# Patient Record
Sex: Female | Born: 1985 | Race: White | Hispanic: No | Marital: Single | Smoking: Current every day smoker
Health system: Southern US, Community
[De-identification: ages and names within clinical notes are randomized; demographics above are authoritative.]

## PROBLEM LIST (undated history)

## (undated) DIAGNOSIS — F99 Mental disorder, not otherwise specified: Secondary | ICD-10-CM

## (undated) HISTORY — PX: BREAST SURGERY: SHX581

## (undated) HISTORY — DX: Mental disorder, not otherwise specified: F99

---
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
## 2012-06-03 ENCOUNTER — Ambulatory Visit (INDEPENDENT_AMBULATORY_CARE_PROVIDER_SITE_OTHER): Payer: BC Managed Care – PPO | Admitting: General Practice

## 2012-06-03 VITALS — BP 104/74 | HR 75 | Temp 97.8°F

## 2012-06-03 DIAGNOSIS — H8110 Benign paroxysmal vertigo, unspecified ear: Secondary | ICD-10-CM

## 2012-06-03 MED ORDER — MECLIZINE HCL 25 MG PO TABS: 25.0000 mg | ORAL_TABLET | Freq: Three times a day (TID) | ORAL | Status: DC | PRN

## 2012-06-03 MED ORDER — MECLIZINE HCL 25 MG PO TABS: 25.0000 mg | ORAL_TABLET | Freq: Three times a day (TID) | ORAL | Status: AC | PRN

## 2012-06-03 MED ORDER — METHYLPREDNISOLONE ACETATE 80 MG/ML IJ SUSP
80.0000 mg | Freq: Once | INTRAMUSCULAR | Status: AC
  Administered 2012-06-03: 80 mg via INTRAMUSCULAR

## 2012-06-03 NOTE — Patient Instructions (Signed)
Vertigo Vertigo means you feel like you or your surroundings are moving when they are not. Vertigo can be dangerous if it occurs when you are at work, driving, or performing difficult activities.  CAUSES  Vertigo occurs when there is a conflict of signals sent to your brain from the visual and sensory systems in your body. There are many different causes of vertigo, including:  Infections, especially in the inner ear.  A bad reaction to a drug or misuse of alcohol and medicines.  Withdrawal from drugs or alcohol.  Rapidly changing positions, such as lying down or rolling over in bed.  A migraine headache.  Decreased blood flow to the brain.  Increased pressure in the brain from a head injury, infection, tumor, or bleeding. SYMPTOMS  You may feel as though the world is spinning around or you are falling to the ground. Because your balance is upset, vertigo can cause nausea and vomiting. You may have involuntary eye movements (nystagmus). DIAGNOSIS  Vertigo is usually diagnosed by physical exam. If the cause of your vertigo is unknown, your caregiver may perform imaging tests, such as an MRI scan (magnetic resonance imaging). TREATMENT  Most cases of vertigo resolve on their own, without treatment. Depending on the cause, your caregiver may prescribe certain medicines. If your vertigo is related to body position issues, your caregiver may recommend movements or procedures to correct the problem. In rare cases, if your vertigo is caused by certain inner ear problems, you may need surgery. HOME CARE INSTRUCTIONS   Follow your caregiver's instructions.  Avoid driving.  Avoid operating heavy machinery.  Avoid performing any tasks that would be dangerous to you or others during a vertigo episode.  Tell your caregiver if you notice that certain medicines seem to be causing your vertigo. Some of the medicines used to treat vertigo episodes can actually make them worse in some people. SEEK  IMMEDIATE MEDICAL CARE IF:   Your medicines do not relieve your vertigo or are making it worse.  You develop problems with talking, walking, weakness, or using your arms, hands, or legs.  You develop severe headaches.  Your nausea or vomiting continues or gets worse.  You develop visual changes.  A family member notices behavioral changes.  Your condition gets worse. MAKE SURE YOU:  Understand these instructions.  Will watch your condition.  Will get help right away if you are not doing well or get worse. Document Released: 10/04/2004 Document Revised: 03/19/2011 Document Reviewed: 07/13/2010 Exit[employer] Patient Information 2014 [employer].  

## 2012-06-03 NOTE — Progress Notes (Signed)
  Subjective:    Patient ID: Nancy Willis, female    DOB: [birth year], 27 y.o.   MRN: [id number]  HPI Presents today with dizziness. Reports onset was Friday while on vacation. She feeling like she is "flying" and in a tunnel. Symptoms worsen with rapid change of position. Denies OTC medications.     Review of Systems  Constitutional: Negative for fever and chills.  Respiratory: Negative for chest tightness and shortness of breath.   Cardiovascular: Negative for chest pain and palpitations.  Gastrointestinal: Negative for abdominal pain.  Genitourinary: Negative for difficulty urinating.  Neurological: Positive for dizziness and light-headedness. Negative for facial asymmetry, speech difficulty, weakness, numbness and headaches.  Psychiatric/Behavioral: Negative.        Objective:   Physical Exam  Constitutional: She is oriented to person, place, and time. She appears well-developed and well-nourished.  HENT:  Head: Normocephalic and atraumatic.  Right Ear: External ear normal.  Left Ear: External ear normal.  Eyes: EOM are normal. Pupils are equal, round, and reactive to light.  Cardiovascular: Normal rate, regular rhythm and normal heart sounds.   Pulmonary/Chest: Effort normal and breath sounds normal. No respiratory distress. She exhibits no tenderness.  Neurological: She is alert and oriented to person, place, and time.  Skin: Skin is warm and dry.  Psychiatric: She has a normal mood and affect.          Assessment & Plan:  1. Benign paroxysmal positional vertigo - methylPREDNISolone acetate (DEPO-MEDROL) injection 80 mg; Inject 1 mL (80 mg total) into the muscle once. - meclizine (ANTIVERT) 25 MG tablet; Take 1 tablet (25 mg total) by mouth 3 (three) times daily as needed.  Dispense: 30 tablet; Refill: 0 -Discussed safety precautions -discussed refraining from driving or operating machinery RTO if symptoms worsen Patient verbalized understanding Coralie Keens, FNP-C

## 2012-10-21 ENCOUNTER — Ambulatory Visit (INDEPENDENT_AMBULATORY_CARE_PROVIDER_SITE_OTHER): Payer: BC Managed Care – PPO | Admitting: *Deleted

## 2012-10-21 DIAGNOSIS — Z23 Encounter for immunization: Secondary | ICD-10-CM

## 2012-10-27 ENCOUNTER — Encounter: Payer: Self-pay | Admitting: Advanced Practice Midwife

## 2012-10-27 ENCOUNTER — Ambulatory Visit (INDEPENDENT_AMBULATORY_CARE_PROVIDER_SITE_OTHER): Payer: BC Managed Care – PPO | Admitting: Advanced Practice Midwife

## 2012-10-27 VITALS — BP 104/72 | HR 88 | Resp 18 | Ht 63.0 in | Wt 154.4 lb

## 2012-10-27 DIAGNOSIS — Z3046 Encounter for surveillance of implantable subdermal contraceptive: Secondary | ICD-10-CM

## 2012-10-27 NOTE — Progress Notes (Signed)
Procedure Note Removal of Nexplanon  Patient had Nexplanon inserted in 2102. Desires removal today. Patient desires fertility.   Reviewed risk and benefits of procedure. Alternative options discussed Patient reported understanding and agreed to continue.   The patient's left arm was palpated and the implant device located. The area was prepped with Betadinex3. The distal end of the device was palpated and 1 cc of 1% lidocaine without epinephrine was injected. A 5 mm incision was made. Any fibrotic tissue was carefully dissected away using blunt and/or sharp dissection. The device was removed in an intact manner. Steri-strips and a sterile dressing were applied to the incision.   Followup: The patient tolerated the procedure well without complications. Standard post-procedure care is explained and return precautions are given.  Patient plans no method  25 min spent with patient greater than 80% spent in counseling and coordination of care.    Avigail Pilling Wilson Singer CNM

## 2012-11-21 ENCOUNTER — Other Ambulatory Visit: Payer: Self-pay | Admitting: General Practice

## 2012-11-21 DIAGNOSIS — J029 Acute pharyngitis, unspecified: Secondary | ICD-10-CM

## 2012-11-21 MED ORDER — AMOXICILLIN 875 MG PO TABS: 875.0000 mg | ORAL_TABLET | Freq: Two times a day (BID) | ORAL | Status: DC

## 2012-12-09 ENCOUNTER — Other Ambulatory Visit: Payer: Self-pay | Admitting: Nurse Practitioner

## 2012-12-09 MED ORDER — AZITHROMYCIN 250 MG PO TABS: ORAL_TABLET | ORAL | Status: DC

## 2012-12-29 ENCOUNTER — Other Ambulatory Visit: Payer: Self-pay | Admitting: General Practice

## 2012-12-29 DIAGNOSIS — B373 Candidiasis of vulva and vagina: Secondary | ICD-10-CM

## 2012-12-29 MED ORDER — FLUCONAZOLE 150 MG PO TABS: 150.0000 mg | ORAL_TABLET | Freq: Once | ORAL | Status: DC

## 2013-01-15 ENCOUNTER — Other Ambulatory Visit: Payer: Self-pay | Admitting: Nurse Practitioner

## 2013-01-15 MED ORDER — FLUCONAZOLE 150 MG PO TABS: ORAL_TABLET | ORAL | Status: DC

## 2013-02-11 ENCOUNTER — Other Ambulatory Visit (INDEPENDENT_AMBULATORY_CARE_PROVIDER_SITE_OTHER): Payer: BC Managed Care – PPO | Admitting: *Deleted

## 2013-02-11 DIAGNOSIS — J020 Streptococcal pharyngitis: Secondary | ICD-10-CM

## 2013-02-11 MED ORDER — CEFTRIAXONE SODIUM 1 G IJ SOLR
1.0000 g | Freq: Once | INTRAMUSCULAR | Status: AC
  Administered 2013-02-11: 1 g via INTRAMUSCULAR

## 2013-02-18 NOTE — Progress Notes (Signed)
Rocephin give IM. Patient tolerated well.

## 2013-02-23 ENCOUNTER — Other Ambulatory Visit: Payer: Self-pay | Admitting: General Practice

## 2013-02-23 DIAGNOSIS — B373 Candidiasis of vulva and vagina: Secondary | ICD-10-CM

## 2013-02-23 DIAGNOSIS — B3731 Acute candidiasis of vulva and vagina: Secondary | ICD-10-CM

## 2013-02-23 MED ORDER — FLUCONAZOLE 150 MG PO TABS: 150.0000 mg | ORAL_TABLET | Freq: Once | ORAL | Status: DC

## 2013-03-24 ENCOUNTER — Other Ambulatory Visit: Payer: Self-pay | Admitting: *Deleted

## 2013-03-24 MED ORDER — OSELTAMIVIR PHOSPHATE 75 MG PO CAPS: 75.0000 mg | ORAL_CAPSULE | Freq: Every day | ORAL | Status: DC

## 2013-03-25 ENCOUNTER — Ambulatory Visit (INDEPENDENT_AMBULATORY_CARE_PROVIDER_SITE_OTHER): Payer: BC Managed Care – PPO | Admitting: Family Medicine

## 2013-03-25 ENCOUNTER — Encounter: Payer: Self-pay | Admitting: Family Medicine

## 2013-03-25 VITALS — BP 98/70 | HR 84 | Temp 97.1°F | Ht 62.0 in | Wt 146.0 lb

## 2013-03-25 DIAGNOSIS — R5383 Other fatigue: Secondary | ICD-10-CM

## 2013-03-25 DIAGNOSIS — K219 Gastro-esophageal reflux disease without esophagitis: Secondary | ICD-10-CM

## 2013-03-25 DIAGNOSIS — R5381 Other malaise: Secondary | ICD-10-CM

## 2013-03-25 DIAGNOSIS — Z0000 Encounter for general adult medical examination without abnormal findings: Secondary | ICD-10-CM

## 2013-03-25 DIAGNOSIS — R635 Abnormal weight gain: Secondary | ICD-10-CM

## 2013-03-25 MED ORDER — PHENTERMINE HCL 37.5 MG PO CAPS: 37.5000 mg | ORAL_CAPSULE | ORAL | Status: AC

## 2013-03-25 NOTE — Progress Notes (Signed)
   Subjective:    Patient ID: Nancy Willis, female    DOB: [birth year], 28 y.o.   MRN: [id number]  HPI  This 28 y.o. female presents for evaluation of CPE without pap.  She had pap in 01/23/13 and it was normal.  She does SBE's.  She has been having fatigue and she attributes it to recent weight gain. She is seeing psych for hx of bipolar and is stable.  She has not had recent labs.  She is a smoker And she does not want to quit.  Review of Systems    No chest pain, SOB, HA, dizziness, vision change, N/V, diarrhea, constipation, dysuria, urinary urgency or frequency, myalgias, arthralgias or rash.  Objective:   Physical Exam Vital signs noted  Well developed well nourished female.  HEENT - Head atraumatic Normocephalic                Eyes - PERRLA, Conjuctiva - clear Sclera- Clear EOMI                Ears - EAC's Wnl TM's Wnl Gross Hearing WNL                Nose - Nares patent                 Throat - oropharanx wnl Respiratory - Lungs CTA bilateral Cardiac - RRR S1 and S2 without murmur GI - Abdomen soft Nontender and bowel sounds active x 4 Extremities - No edema. Neuro - Grossly intact.       Assessment & Plan:  Weight gain - Plan: phentermine 37.5 MG capsule  Routine general medical examination at a health care facility - Plan: POCT CBC, CMP14+EGFR, Lipid panel, Thyroid Panel With TSH, Vitamin B12, Vit D  25 hydroxy (rtn osteoporosis monitoring)  GERD (gastroesophageal reflux disease) - Plan: POCT CBC, CMP14+EGFR, Lipid panel, Thyroid Panel With TSH, Vitamin B12, Vit D  25 hydroxy (rtn osteoporosis monitoring)  Other malaise and fatigue - Plan: Vitamin B12, Vit D  25 hydroxy (rtn osteoporosis monitoring)  Lysbeth Penner FNP

## 2013-03-26 LAB — POCT CBC
Granulocyte percent: 70.8 %G (ref 37–80)
HCT, POC: 43.2 % (ref 37.7–47.9)
Hemoglobin: 14 g/dL (ref 12.2–16.2)
Lymph, poc: 1.9 (ref 0.6–3.4)
MCH, POC: 28.7 pg (ref 27–31.2)
MCHC: 32.5 g/dL (ref 31.8–35.4)
MCV: 88.3 fL (ref 80–97)
MPV: 9 fL (ref 0–99.8)
POC Granulocyte: 5.3 (ref 2–6.9)
POC LYMPH PERCENT: 25 %L (ref 10–50)
Platelet Count, POC: 211 10*3/uL (ref 142–424)
RBC: 4.9 M/uL (ref 4.04–5.48)
RDW, POC: 12.6 %
WBC: 7.5 10*3/uL (ref 4.6–10.2)

## 2013-03-27 ENCOUNTER — Other Ambulatory Visit: Payer: Self-pay | Admitting: Nurse Practitioner

## 2013-03-27 LAB — CMP14+EGFR
ALT: 9 IU/L (ref 0–32)
AST: 13 IU/L (ref 0–40)
Albumin/Globulin Ratio: 2.3 (ref 1.1–2.5)
Albumin: 4.8 g/dL (ref 3.5–5.5)
Alkaline Phosphatase: 48 IU/L (ref 39–117)
BUN/Creatinine Ratio: 10 (ref 8–20)
BUN: 8 mg/dL (ref 6–20)
CO2: 22 mmol/L (ref 18–29)
Calcium: 9.8 mg/dL (ref 8.7–10.2)
Chloride: 104 mmol/L (ref 97–108)
Creatinine, Ser: 0.84 mg/dL (ref 0.57–1.00)
GFR calc Af Amer: 112 mL/min/{1.73_m2} (ref >59)
GFR calc non Af Amer: 97 mL/min/{1.73_m2} (ref >59)
Globulin, Total: 2.1 g/dL (ref 1.5–4.5)
Glucose: 94 mg/dL (ref 65–99)
Potassium: 4.6 mmol/L (ref 3.5–5.2)
Sodium: 142 mmol/L (ref 134–144)
Total Bilirubin: 0.7 mg/dL (ref 0.0–1.2)
Total Protein: 6.9 g/dL (ref 6.0–8.5)

## 2013-03-27 LAB — THYROID PANEL WITH TSH
Free Thyroxine Index: 2.5 (ref 1.2–4.9)
T3 Uptake Ratio: 34 % (ref 24–39)
T4, Total: 7.4 ug/dL (ref 4.5–12.0)
TSH: 1.19 u[IU]/mL (ref 0.450–4.500)

## 2013-03-27 LAB — VITAMIN D 25 HYDROXY (VIT D DEFICIENCY, FRACTURES): Vit D, 25-Hydroxy: 46.4 ng/mL (ref 30.0–100.0)

## 2013-03-27 LAB — VITAMIN B12: Vitamin B-12: 307 pg/mL (ref 211–946)

## 2013-03-27 LAB — LIPID PANEL
Chol/HDL Ratio: 3.4 ratio units (ref 0.0–4.4)
Cholesterol, Total: 134 mg/dL (ref 100–199)
HDL: 40 mg/dL (ref >39)
LDL Calculated: 79 mg/dL (ref 0–99)
Triglycerides: 75 mg/dL (ref 0–149)
VLDL Cholesterol Cal: 15 mg/dL (ref 5–40)

## 2013-03-27 MED ORDER — METRONIDAZOLE 500 MG PO TABS: 500.0000 mg | ORAL_TABLET | Freq: Two times a day (BID) | ORAL | Status: DC

## 2013-03-30 ENCOUNTER — Ambulatory Visit (INDEPENDENT_AMBULATORY_CARE_PROVIDER_SITE_OTHER): Payer: BC Managed Care – PPO | Admitting: Family Medicine

## 2013-03-30 VITALS — BP 94/65 | HR 107 | Temp 98.3°F | Wt 143.4 lb

## 2013-03-30 DIAGNOSIS — J029 Acute pharyngitis, unspecified: Secondary | ICD-10-CM

## 2013-03-30 MED ORDER — FLUCONAZOLE 150 MG PO TABS
150.0000 mg | ORAL_TABLET | Freq: Once | ORAL | Status: DC
Start: 2013-03-30 — End: 2013-04-16

## 2013-03-30 MED ORDER — AZITHROMYCIN 250 MG PO TABS: ORAL_TABLET | ORAL | Status: DC

## 2013-03-30 NOTE — Progress Notes (Signed)
   Subjective:    Patient ID: Nancy Willis, female    DOB: 01-12-1985, 28 y.o.   MRN: [id number]  HPI This 28 y.o. female presents for evaluation of sore throat and uri sx's for over 3 days.   Review of Systems C/o sore throat No chest pain, SOB, HA, dizziness, vision change, N/V, diarrhea, constipation, dysuria, urinary urgency or frequency, myalgias, arthralgias or rash.     Objective:   Physical Exam Vital signs noted  Well developed well nourished female.  HEENT - Head atraumatic Normocephalic                Eyes - PERRLA, Conjuctiva - clear Sclera- Clear EOMI                Ears - EAC's Wnl TM's Wnl Gross Hearing WNL                Throat - oropharanx injected Respiratory - Lungs CTA bilateral Cardiac - RRR S1 and S2 without murmur GI - Abdomen soft Nontender and bowel sounds active x 4 Extremities - No edema. Neuro - Grossly intact.       Assessment & Plan:  Acute pharyngitis - Plan: azithromycin (ZITHROMAX Z-PAK) 250 MG tablet, fluconazole (DIFLUCAN) 150 MG tablet Push po fluids, rest, tylenol and motrin otc prn as directed for fever, arthralgias, and myalgias.  Follow up prn if sx's continue or persist.  Deatra CanterWilliam J Oxford FNP

## 2013-03-31 LAB — POCT RAPID STREP A (OFFICE): Rapid Strep A Screen: NEGATIVE

## 2013-03-31 LAB — POCT INFLUENZA A/B
Influenza A, POC: NEGATIVE
Influenza B, POC: NEGATIVE

## 2013-03-31 NOTE — Addendum Note (Signed)
Addended by: Orma RenderHODGES, Jams Trickett F on: 03/31/2013 07:50 AM   Modules accepted: Orders

## 2013-04-13 ENCOUNTER — Ambulatory Visit (INDEPENDENT_AMBULATORY_CARE_PROVIDER_SITE_OTHER): Payer: BC Managed Care – PPO | Admitting: Family Medicine

## 2013-04-13 DIAGNOSIS — IMO0001 Reserved for inherently not codable concepts without codable children: Secondary | ICD-10-CM

## 2013-04-13 DIAGNOSIS — Z309 Encounter for contraceptive management, unspecified: Secondary | ICD-10-CM

## 2013-04-13 MED ORDER — MEDROXYPROGESTERONE ACETATE 150 MG/ML IM SUSP: 150.0000 mg | INTRAMUSCULAR | Status: AC

## 2013-04-13 NOTE — Addendum Note (Signed)
Addended by: Orma RenderHODGES, Ashantee Deupree F on: 04/13/2013 10:27 AM   Modules accepted: Orders

## 2013-04-13 NOTE — Progress Notes (Signed)
   Subjective:    Patient ID: Elizebeth KollerJessica Gates, female    DOB: [birth year], 28 y.o.   MRN: [id number]  HPI This 28 y.o. female presents for evaluation of needing contraception.  She is currently on Depo and she is due 04/15/13.  She P3 G2 M1. She has light menses.  She does SBE's And she denies any vaginal DC or pelvic pain. She is up to date with her pap and she states She had normal pap on 12 Jan 15.   Review of Systems    No chest pain, SOB, HA, dizziness, vision change, N/V, diarrhea, constipation, dysuria, urinary urgency or frequency, myalgias, arthralgias or rash.  Objective:   Physical Exam  Vital signs noted  Well developed well nourished female.  HEENT - Head atraumatic Normocephalic                Eyes - PERRLA, Conjuctiva - clear Sclera- Clear EOMI                Ears - EAC's Wnl TM's Wnl Gross Hearing WNL                Nose - Nares patent                 Throat - oropharanx wnl Respiratory - Lungs CTA bilateral Cardiac - RRR S1 and S2 without murmur GI - Abdomen soft Nontender and bowel sounds active x 4 Extremities - No edema. Neuro - Grossly intact.     Assessment & Plan:

## 2013-04-14 LAB — HCG, SERUM, QUALITATIVE: hCG,Beta Subunit,Qual,Serum: NEGATIVE m[IU]/mL (ref <6)

## 2013-04-16 ENCOUNTER — Ambulatory Visit (INDEPENDENT_AMBULATORY_CARE_PROVIDER_SITE_OTHER): Payer: BC Managed Care – PPO | Admitting: Family Medicine

## 2013-04-16 ENCOUNTER — Encounter: Payer: Self-pay | Admitting: Family Medicine

## 2013-04-16 ENCOUNTER — Ambulatory Visit (INDEPENDENT_AMBULATORY_CARE_PROVIDER_SITE_OTHER): Payer: BC Managed Care – PPO

## 2013-04-16 VITALS — BP 89/62 | HR 78 | Temp 97.7°F | Wt 142.8 lb

## 2013-04-16 DIAGNOSIS — R059 Cough, unspecified: Secondary | ICD-10-CM

## 2013-04-16 DIAGNOSIS — R05 Cough: Secondary | ICD-10-CM

## 2013-04-16 DIAGNOSIS — J209 Acute bronchitis, unspecified: Secondary | ICD-10-CM

## 2013-04-16 MED ORDER — METHYLPREDNISOLONE ACETATE 80 MG/ML IJ SUSP
80.0000 mg | Freq: Once | INTRAMUSCULAR | Status: AC
  Administered 2013-04-16: 80 mg via INTRAMUSCULAR

## 2013-04-16 MED ORDER — LEVALBUTEROL HCL 1.25 MG/0.5ML IN NEBU
1.2500 mg | INHALATION_SOLUTION | Freq: Once | RESPIRATORY_TRACT | Status: AC
  Administered 2013-04-16: 1.25 mg via RESPIRATORY_TRACT

## 2013-04-16 MED ORDER — AZITHROMYCIN 250 MG PO TABS: ORAL_TABLET | ORAL | Status: DC

## 2013-04-16 MED ORDER — BENZONATATE 100 MG PO CAPS: 200.0000 mg | ORAL_CAPSULE | Freq: Three times a day (TID) | ORAL | Status: DC | PRN

## 2013-04-16 NOTE — Progress Notes (Signed)
   Subjective:    Patient ID: Nancy KollerJessica Willis, female    DOB: 1985-10-05, 28 y.o.   MRN: [id number]  HPI This 28 y.o. female presents for evaluation of cough, congestion, and uri sx's.  She has  Been having sx's for over 24 hours.  She has been having persistent cough and she has Been wheezing.  She has hx of cigarette smoking.   Review of Systems C/o cough and uri sx's No chest pain, SOB, HA, dizziness, vision change, N/V, diarrhea, constipation, dysuria, urinary urgency or frequency, myalgias, arthralgias or rash.     Objective:   Physical Exam  Vital signs noted  Well developed well nourished female.  HEENT - Head atraumatic Normocephalic                Eyes - PERRLA, Conjuctiva - clear Sclera- Clear EOMI                Ears - EAC's Wnl TM's Wnl Gross Hearing WNL                Throat - oropharanx wnl Respiratory - Lungs with expiratory wheezes scattered Cardiac - RRR S1 and S2 without murmur GI - Abdomen soft Nontender and bowel sounds active x 4      Assessment & Plan:  Cough - Plan: DG Chest 2 View, azithromycin (ZITHROMAX) 250 MG tablet, methylPREDNISolone acetate (DEPO-MEDROL) injection 80 mg, benzonatate (TESSALON PERLES) 100 MG capsule, levalbuterol (XOPENEX) nebulizer solution 1.25 mg  Acute bronchitis - Plan: azithromycin (ZITHROMAX) 250 MG tablet, methylPREDNISolone acetate (DEPO-MEDROL) injection 80 mg, benzonatate (TESSALON PERLES) 100 MG capsule, levalbuterol (XOPENEX) nebulizer solution 1.25 mg  Tobacco abuse - DC tobacco  Push po fluids, rest, tylenol and motrin otc prn as directed for fever, arthralgias, and myalgias.  Follow up prn if sx's continue or persist.  Deatra CanterWilliam J Zuriel Roskos FNP

## 2013-04-16 NOTE — Progress Notes (Signed)
   Subjective:    Patient ID: Nancy KollerJessica Chagoya, female    DOB: 12-04-1985, 28 y.o.   MRN: [id number]  HPI    Review of Systems     Objective:   Physical Exam        Assessment & Plan:  Contraception - Plan: medroxyPROGESTERone (DEPO-PROVERA) 150 MG/ML injection, hCG, serum, qualitative  Deatra CanterWilliam J Ruston Fedora FNP

## 2013-04-27 ENCOUNTER — Other Ambulatory Visit: Payer: BC Managed Care – PPO

## 2013-04-28 ENCOUNTER — Ambulatory Visit: Payer: BC Managed Care – PPO | Admitting: Advanced Practice Midwife

## 2013-05-14 ENCOUNTER — Telehealth: Payer: Self-pay | Admitting: Family Medicine

## 2013-05-14 MED ORDER — FLUCONAZOLE 150 MG PO TABS: 150.0000 mg | ORAL_TABLET | Freq: Once | ORAL | Status: DC

## 2013-05-14 NOTE — Telephone Encounter (Signed)
Wants diflucan called in please

## 2013-05-15 ENCOUNTER — Other Ambulatory Visit: Payer: Self-pay | Admitting: Family Medicine

## 2013-06-22 ENCOUNTER — Other Ambulatory Visit: Payer: Self-pay | Admitting: Nurse Practitioner

## 2013-06-22 MED ORDER — MEDROXYPROGESTERONE ACETATE 10 MG PO TABS: 10.0000 mg | ORAL_TABLET | Freq: Every day | ORAL | Status: DC

## 2013-06-26 ENCOUNTER — Ambulatory Visit (INDEPENDENT_AMBULATORY_CARE_PROVIDER_SITE_OTHER): Payer: BC Managed Care – PPO | Admitting: Family

## 2013-06-26 ENCOUNTER — Encounter: Payer: Self-pay | Admitting: Family

## 2013-06-26 VITALS — BP 86/60 | HR 96 | Temp 97.6°F | Ht 62.0 in | Wt 142.0 lb

## 2013-06-26 DIAGNOSIS — J029 Acute pharyngitis, unspecified: Secondary | ICD-10-CM

## 2013-06-26 DIAGNOSIS — J020 Streptococcal pharyngitis: Secondary | ICD-10-CM

## 2013-06-26 LAB — POCT RAPID STREP A (OFFICE): Rapid Strep A Screen: POSITIVE — AB

## 2013-06-26 MED ORDER — CEFTRIAXONE SODIUM 1 G IJ SOLR
1.0000 g | Freq: Once | INTRAMUSCULAR | Status: AC
  Administered 2013-06-26: 1 g via INTRAMUSCULAR

## 2013-06-26 MED ORDER — AMOXICILLIN 500 MG PO CAPS: 500.0000 mg | ORAL_CAPSULE | Freq: Two times a day (BID) | ORAL | Status: DC

## 2013-06-26 NOTE — Addendum Note (Signed)
Addended by: Prescott GumLAND, Ginevra M on: 06/26/2013 12:05 PM   Modules accepted: Orders

## 2013-06-26 NOTE — Patient Instructions (Signed)
Strep Throat Strep throat is an infection of the throat caused by a bacteria named Streptococcus pyogenes. Your caregiver may call the infection streptococcal "tonsillitis" or "pharyngitis" depending on whether there are signs of inflammation in the tonsils or back of the throat. Strep throat is most common in children aged 26-15 years during the cold months of the year, but it can occur in people of any age during any season. This infection is spread from person to person (contagious) through coughing, sneezing, or other close contact. SYMPTOMS   Fever or chills.  Painful, swollen, red tonsils or throat.  Pain or difficulty when swallowing.  White or yellow spots on the tonsils or throat.  Swollen, tender lymph nodes or "glands" of the neck or under the jaw.  Red rash all over the body (rare). DIAGNOSIS  Many different infections can cause the same symptoms. A test must be done to confirm the diagnosis so the right treatment can be given. A "rapid strep test" can help your caregiver make the diagnosis in a few minutes. If this test is not available, a light swab of the infected area can be used for a throat culture test. If a throat culture test is done, results are usually available in a day or two. TREATMENT  Strep throat is treated with antibiotic medicine. HOME CARE INSTRUCTIONS   Gargle with 1 tsp of salt in 1 cup of warm water, 3-4 times per day or as needed for comfort.  Family members who also have a sore throat or fever should be tested for strep throat and treated with antibiotics if they have the strep infection.  Make sure everyone in your household washes their hands well.  Do not share food, drinking cups, or personal items that could cause the infection to spread to others.  You may need to eat a soft food diet until your sore throat gets better.  Drink enough water and fluids to keep your urine clear or pale yellow. This will help prevent dehydration.  Get plenty of  rest.  Stay home from school, daycare, or work until you have been on antibiotics for 24 hours.  Only take over-the-counter or prescription medicines for pain, discomfort, or fever as directed by your caregiver.  If antibiotics are prescribed, take them as directed. Finish them even if you start to feel better. SEEK MEDICAL CARE IF:   The glands in your neck continue to enlarge.  You develop a rash, cough, or earache.  You cough up green, yellow-brown, or bloody sputum.  You have pain or discomfort not controlled by medicines.  Your problems seem to be getting worse rather than better. SEEK IMMEDIATE MEDICAL CARE IF:   You develop any new symptoms such as vomiting, severe headache, stiff or painful neck, chest pain, shortness of breath, or trouble swallowing.  You develop severe throat pain, drooling, or changes in your voice.  You develop swelling of the neck, or the skin on the neck becomes red and tender.  You have a fever.  You develop signs of dehydration, such as fatigue, dry mouth, and decreased urination.  You become increasingly sleepy, or you cannot wake up completely. Document Released: 12/23/1999 Document Revised: 12/12/2011 Document Reviewed: 02/23/2010 Exit[employer] Patient Information 2015 [employer]. This information is not intended to replace advice given to you by your health care provider. Make sure you discuss any questions you have with your health care provider.  

## 2013-06-26 NOTE — Progress Notes (Signed)
   Subjective:    Patient ID: Nancy KollerJessica Willis, female    DOB: May 30, 1985, 28 y.o.   MRN: [id number]  Sore Throat  This is a recurrent problem. The current episode started in the past 7 days. The problem has been waxing and waning. There has been no fever. The pain is at a severity of 4/10. The pain is mild. Associated symptoms include ear pain, swollen glands and trouble swallowing. Pertinent negatives include no congestion, ear discharge or hoarse voice. She has had exposure to strep. The treatment provided mild relief.      Review of Systems  HENT: Positive for ear pain and trouble swallowing. Negative for congestion, ear discharge and hoarse voice.   All other systems reviewed and are negative.      Objective:   Physical Exam  Vitals reviewed. Constitutional: She is oriented to person, place, and time. She appears well-developed and well-nourished. No distress.  HENT:  Head: Normocephalic and atraumatic.  Right Ear: External ear normal.  Left Ear: External ear normal.  Mouth/Throat: Oropharyngeal exudate present.  Oropharynx erythemas   Eyes: Pupils are equal, round, and reactive to light.  Neck: Normal range of motion. Neck supple. No thyromegaly present.  Cardiovascular: Normal rate, regular rhythm, normal heart sounds and intact distal pulses.   No murmur heard. Pulmonary/Chest: Effort normal and breath sounds normal. No respiratory distress. She has no wheezes.  Abdominal: Soft. Bowel sounds are normal. She exhibits no distension. There is no tenderness.  Musculoskeletal: Normal range of motion. She exhibits no edema and no tenderness.  Lymphadenopathy:    She has cervical adenopathy.  Neurological: She is alert and oriented to person, place, and time.  Skin: Skin is warm and dry.  Psychiatric: She has a normal mood and affect. Her behavior is normal. Judgment and thought content normal.    Results for orders placed in visit on 06/26/13  POCT RAPID STREP A (OFFICE)    Result Value Ref Range   Rapid Strep A Screen Positive (*) Negative     BP 86/60  Pulse 96  Temp(Src) 97.6 F (36.4 C) (Oral)  Ht 5\' 2"  (1.575 m)  Wt 142 lb (64.411 kg)  BMI 25.97 kg/m2     Assessment & Plan:  1. Sore throat - POCT rapid strep A - POCT CBC - amoxicillin (AMOXIL) 500 MG capsule; Take 1 capsule (500 mg total) by mouth 2 (two) times daily.  Dispense: 20 capsule; Refill: 0  2. Recurrent streptococcal tonsillitis -Gargle with warm salt water if helps -Force fluids -Tylenol prn for pain - Ambulatory referral to ENT - Antistreptolysin O titer - amoxicillin (AMOXIL) 500 MG capsule; Take 1 capsule (500 mg total) by mouth 2 (two) times daily.  Dispense: 20 capsule; Refill: 0  3. Bruise - Anemia Profile B  Jannifer Rodneyhristy Yury Schaus, FNP

## 2013-06-26 NOTE — Addendum Note (Signed)
Addended by: Gwenith DailyHUDY, KRISTEN N on: 06/26/2013 10:53 AM   Modules accepted: Orders

## 2013-06-29 ENCOUNTER — Other Ambulatory Visit: Payer: Self-pay | Admitting: Family

## 2013-06-29 LAB — ANEMIA PROFILE B
FERRITIN: 25 ng/mL (ref 15–150)
FOLATE: 8.5 ng/mL (ref >3.0)
Iron Saturation: 9 % — CL (ref 15–55)
Iron: 31 ug/dL — ABNORMAL LOW (ref 35–155)
TIBC: 339 ug/dL (ref 250–450)
UIBC: 308 ug/dL (ref 150–375)
Vitamin B-12: 221 pg/mL (ref 211–946)

## 2013-06-29 LAB — ANTISTREPTOLYSIN O TITER: ASO: 616.7 [IU]/mL — AB (ref 0.0–200.0)

## 2013-06-29 MED ORDER — FERROUS SULFATE 90 (18 FE) MG PO TABS: 90.0000 mg | ORAL_TABLET | Freq: Two times a day (BID) | ORAL | Status: AC

## 2013-07-02 ENCOUNTER — Ambulatory Visit: Payer: BC Managed Care – PPO | Admitting: Obstetrics

## 2013-07-02 ENCOUNTER — Encounter: Payer: Self-pay | Admitting: Obstetrics

## 2013-07-02 ENCOUNTER — Ambulatory Visit (INDEPENDENT_AMBULATORY_CARE_PROVIDER_SITE_OTHER): Payer: BC Managed Care – PPO | Admitting: Obstetrics

## 2013-07-02 VITALS — BP 107/72 | HR 80 | Temp 98.4°F | Wt 140.0 lb

## 2013-07-02 DIAGNOSIS — N939 Abnormal uterine and vaginal bleeding, unspecified: Principal | ICD-10-CM

## 2013-07-02 DIAGNOSIS — Z3049 Encounter for surveillance of other contraceptives: Secondary | ICD-10-CM

## 2013-07-02 DIAGNOSIS — N926 Irregular menstruation, unspecified: Secondary | ICD-10-CM | POA: Insufficient documentation

## 2013-07-02 NOTE — Progress Notes (Signed)
Patient ID: Nancy KollerJessica Willis, female   DOB: 1985-07-19, 28 y.o.   MRN: [id number]  Chief Complaint  Patient presents with  . Menstrual Problem    low iron and heavy bleeding    HPI Nancy Willis is a 28 y.o. female.  H/O heavy periods.  Now on Depo Provera for 2 cycles.  Still bleeding.  Concerned about developing anemia. HPI  Past Medical History  Diagnosis Date  . Mental disorder     Anxiety    Past Surgical History  Procedure Laterality Date  . Cesarean section      HELLP Syndrome  . Breast surgery      Reduction    History reviewed. No pertinent family history.  Social History History  Substance Use Topics  . Smoking status: Current Every Day Smoker  . Smokeless tobacco: Not on file  . Alcohol Use: Yes     Comment: occasional    Allergies  Allergen Reactions  . Latex     Current Outpatient Prescriptions  Medication Sig Dispense Refill  . ALPRAZolam (XANAX) 1 MG tablet Take 1 mg by mouth 5 (five) times daily.       . medroxyPROGESTERone (DEPO-PROVERA) 150 MG/ML injection Inject 1 mL (150 mg total) into the muscle every 3 (three) months.  1 mL  3  . amoxicillin (AMOXIL) 500 MG capsule Take 1 capsule (500 mg total) by mouth 2 (two) times daily.  20 capsule  0  . Ferrous Sulfate 90 (18 FE) MG TABS Take 90 mg by mouth 2 (two) times daily.  30 each  6  . meclizine (ANTIVERT) 25 MG tablet Take 1 tablet (25 mg total) by mouth 3 (three) times daily as needed.  30 tablet  0  . phentermine 37.5 MG capsule Take 1 capsule (37.5 mg total) by mouth every morning.  30 capsule  3   No current facility-administered medications for this visit.    Review of Systems Review of Systems Constitutional: negative for fatigue and weight loss Respiratory: negative for cough and wheezing Cardiovascular: negative for chest pain, fatigue and palpitations Gastrointestinal: negative for abdominal pain and change in bowel habits Genitourinary:negative Integument/breast: negative for  nipple discharge Musculoskeletal:negative for myalgias Neurological: negative for gait problems and tremors Behavioral/Psych: negative for abusive relationship, depression Endocrine: negative for temperature intolerance     Blood pressure 107/72, pulse 80, temperature 98.4 F (36.9 C), weight 140 lb (63.504 kg), last menstrual period 06/11/2013.  Physical Exam Physical Exam General:   alert  Skin:   no rash or abnormalities  Lungs:   clear to auscultation bilaterally  Heart:   regular rate and rhythm, S1, S2 normal, no murmur, click, rub or gallop  Breasts:   normal without suspicious masses, skin or nipple changes or axillary nodes  Abdomen:  normal findings: no organomegaly, soft, non-tender and no hernia  Pelvis:  External genitalia: normal general appearance Urinary system: urethral meatus normal and bladder without fullness, nontender Vaginal: normal without tenderness, induration or masses Cervix: normal appearance Adnexa: normal bimanual exam Uterus: anteverted and non-tender, normal size      Data Reviewed Labs  Assessment    AUB.  Slow response to Depo Provera.  Last Hgb = 14grms in March.  Iron levels are low.     Plan    Continue Depo Provera. Iron samples dispensed.  Orders Placed This Encounter  Procedures  . WET PREP BY MOLECULAR PROBE  . GC/Chlamydia Probe Amp  . CBC   No orders of the defined  types were placed in this encounter.       Shunta Mclaurin A 07/02/2013, 3:40 PM

## 2013-07-03 ENCOUNTER — Other Ambulatory Visit (INDEPENDENT_AMBULATORY_CARE_PROVIDER_SITE_OTHER): Payer: BC Managed Care – PPO

## 2013-07-03 DIAGNOSIS — N938 Other specified abnormal uterine and vaginal bleeding: Secondary | ICD-10-CM

## 2013-07-03 DIAGNOSIS — N925 Other specified irregular menstruation: Secondary | ICD-10-CM

## 2013-07-03 DIAGNOSIS — D509 Iron deficiency anemia, unspecified: Secondary | ICD-10-CM

## 2013-07-03 DIAGNOSIS — N949 Unspecified condition associated with female genital organs and menstrual cycle: Secondary | ICD-10-CM

## 2013-07-03 NOTE — Progress Notes (Signed)
Patient came in for labs only.

## 2013-07-04 LAB — CBC
HCT: 39.6 % (ref 34.0–46.6)
Hemoglobin: 13.7 g/dL (ref 11.1–15.9)
MCH: 30.9 pg (ref 26.6–33.0)
MCHC: 34.6 g/dL (ref 31.5–35.7)
MCV: 89 fL (ref 79–97)
Platelets: 253 10*3/uL (ref 150–379)
RBC: 4.44 x10E6/uL (ref 3.77–5.28)
RDW: 14 % (ref 12.3–15.4)
WBC: 6 10*3/uL (ref 3.4–10.8)

## 2013-07-06 LAB — VAGINITIS/VAGINOSIS, DNA PROBE
Candida Species: NEGATIVE
Gardnerella vaginalis: POSITIVE — AB
TRICHOMONAS VAG: NEGATIVE

## 2013-07-06 LAB — WET PREP W/ TRICH CULT REFLEX

## 2013-07-08 LAB — GC/CHLAMYDIA PROBE AMP
Chlamydia trachomatis, NAA: NEGATIVE
Neisseria gonorrhoeae by PCR: NEGATIVE

## 2013-07-15 ENCOUNTER — Ambulatory Visit (INDEPENDENT_AMBULATORY_CARE_PROVIDER_SITE_OTHER): Payer: BC Managed Care – PPO

## 2013-07-15 ENCOUNTER — Encounter: Payer: BC Managed Care – PPO | Admitting: Family Medicine

## 2013-07-15 DIAGNOSIS — M25562 Pain in left knee: Secondary | ICD-10-CM

## 2013-07-15 DIAGNOSIS — M25569 Pain in unspecified knee: Secondary | ICD-10-CM
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
## 2013-07-17 NOTE — Progress Notes (Signed)
   Subjective:    Patient ID: Nancy KollerJessica Willis, female    DOB: Oct 24, 1985, 28 y.o.   MRN: [id number]  HPI    Review of Systems      Objective:   Physical Exam     Assessment & Plan:

## 2013-07-23 ENCOUNTER — Other Ambulatory Visit: Payer: Self-pay | Admitting: *Deleted

## 2013-07-23 DIAGNOSIS — N760 Acute vaginitis: Principal | ICD-10-CM

## 2013-07-23 DIAGNOSIS — B9689 Other specified bacterial agents as the cause of diseases classified elsewhere: Secondary | ICD-10-CM

## 2013-07-23 MED ORDER — METRONIDAZOLE 500 MG PO TABS: 500.0000 mg | ORAL_TABLET | Freq: Two times a day (BID) | ORAL | Status: DC

## 2013-09-09 ENCOUNTER — Other Ambulatory Visit: Payer: Self-pay | Admitting: Family Medicine

## 2013-09-09 ENCOUNTER — Other Ambulatory Visit: Payer: BC Managed Care – PPO

## 2013-09-09 DIAGNOSIS — L03818 Cellulitis of other sites: Secondary | ICD-10-CM

## 2013-09-09 MED ORDER — DOXYCYCLINE HYCLATE 100 MG PO TABS: 100.0000 mg | ORAL_TABLET | Freq: Two times a day (BID) | ORAL | Status: DC

## 2013-09-09 NOTE — Progress Notes (Signed)
Patient came in for labs only.

## 2013-09-13 LAB — ANAEROBIC AND AEROBIC CULTURE

## 2013-10-12 ENCOUNTER — Other Ambulatory Visit: Payer: Self-pay | Admitting: Nurse Practitioner

## 2013-10-14 LAB — CBC WITH DIFFERENTIAL
Basophils Absolute: 0 10*3/uL (ref 0.0–0.2)
Basos: 0 %
EOS ABS: 0.2 10*3/uL (ref 0.0–0.4)
Eos: 2 %
HCT: 42.6 % (ref 34.0–46.6)
HEMOGLOBIN: 14.9 g/dL (ref 11.1–15.9)
IMMATURE GRANS (ABS): 0 10*3/uL (ref 0.0–0.1)
Immature Granulocytes: 0 %
Lymphocytes Absolute: 1.8 10*3/uL (ref 0.7–3.1)
Lymphs: 18 %
MCH: 30.5 pg (ref 26.6–33.0)
MCHC: 35 g/dL (ref 31.5–35.7)
MCV: 87 fL (ref 79–97)
MONOS ABS: 0.5 10*3/uL (ref 0.1–0.9)
Monocytes: 5 %
NEUTROS PCT: 75 %
Neutrophils Absolute: 7.1 10*3/uL — ABNORMAL HIGH (ref 1.4–7.0)
Platelets: 305 10*3/uL (ref 150–379)
RBC: 4.89 x10E6/uL (ref 3.77–5.28)
RDW: 12.6 % (ref 12.3–15.4)
WBC: 9.7 10*3/uL (ref 3.4–10.8)

## 2013-10-14 LAB — HIV ANTIBODY (ROUTINE TESTING W REFLEX): HIV-1/HIV-2 Ab: NONREACTIVE

## 2013-10-14 LAB — RPR: RPR: NONREACTIVE

## 2013-10-14 LAB — HEPATIC FUNCTION PANEL
ALT: 9 IU/L (ref 0–32)
AST: 13 IU/L (ref 0–40)
Albumin: 4.9 g/dL (ref 3.5–5.5)
Alkaline Phosphatase: 61 IU/L (ref 39–117)
BILIRUBIN DIRECT: 0.15 mg/dL (ref 0.00–0.40)
Total Bilirubin: 0.7 mg/dL (ref 0.0–1.2)
Total Protein: 7.7 g/dL (ref 6.0–8.5)

## 2013-10-14 LAB — GC/CHLAMYDIA PROBE AMP
Chlamydia trachomatis, NAA: NEGATIVE
Neisseria gonorrhoeae by PCR: POSITIVE — AB

## 2013-10-14 LAB — HEPATITIS PANEL, ACUTE
HEP A IGM: NEGATIVE
Hep B C IgM: NEGATIVE
Hep C Virus Ab: <0.1 s/co ratio (ref 0.0–0.9)
Hepatitis B Surface Ag: NEGATIVE

## 2013-10-20 ENCOUNTER — Other Ambulatory Visit: Payer: Self-pay | Admitting: Nurse Practitioner

## 2013-10-21 LAB — IRON AND TIBC
IRON: 33 ug/dL — AB (ref 35–155)
Iron Saturation: 8 % — CL (ref 15–55)
TIBC: 403 ug/dL (ref 250–450)
UIBC: 370 ug/dL (ref 150–375)

## 2013-10-21 LAB — HCG, QUANTITATIVE, PREGNANCY: hCG Quant: <1 m[IU]/mL

## 2013-10-23 ENCOUNTER — Ambulatory Visit (INDEPENDENT_AMBULATORY_CARE_PROVIDER_SITE_OTHER): Payer: BC Managed Care – PPO | Admitting: Nurse Practitioner

## 2013-10-23 ENCOUNTER — Encounter: Payer: Self-pay | Admitting: Nurse Practitioner

## 2013-10-23 ENCOUNTER — Other Ambulatory Visit: Payer: Self-pay | Admitting: Nurse Practitioner

## 2013-10-23 VITALS — BP 117/83 | HR 104 | Temp 97.3°F | Ht 62.0 in | Wt 143.6 lb

## 2013-10-23 DIAGNOSIS — Z9189 Other specified personal risk factors, not elsewhere classified: Secondary | ICD-10-CM

## 2013-10-23 DIAGNOSIS — N3000 Acute cystitis without hematuria: Secondary | ICD-10-CM

## 2013-10-23 DIAGNOSIS — Z9151 Personal history of suicidal behavior: Secondary | ICD-10-CM

## 2013-10-23 DIAGNOSIS — Z915 Personal history of self-harm: Secondary | ICD-10-CM

## 2013-10-23 DIAGNOSIS — Z09 Encounter for follow-up examination after completed treatment for conditions other than malignant neoplasm: Secondary | ICD-10-CM

## 2013-10-23 DIAGNOSIS — Z7251 High risk heterosexual behavior: Secondary | ICD-10-CM

## 2013-10-23 DIAGNOSIS — R300 Dysuria: Secondary | ICD-10-CM

## 2013-10-23 LAB — POCT UA - MICROSCOPIC ONLY
Bacteria, U Microscopic: NEGATIVE
Casts, Ur, LPF, POC: NEGATIVE
Crystals, Ur, HPF, POC: NEGATIVE
Mucus, UA: NEGATIVE
RBC, URINE, MICROSCOPIC: NEGATIVE
Yeast, UA: NEGATIVE

## 2013-10-23 LAB — POCT URINALYSIS DIPSTICK
Bilirubin, UA: NEGATIVE
Blood, UA: NEGATIVE
GLUCOSE UA: NEGATIVE
Ketones, UA: NEGATIVE
NITRITE UA: NEGATIVE
Protein, UA: NEGATIVE
Spec Grav, UA: <=1.005
UROBILINOGEN UA: NEGATIVE
pH, UA: 7.5

## 2013-10-23 LAB — POCT WET PREP (WET MOUNT)
KOH WET PREP POC: POSITIVE
TRICHOMONAS WET PREP HPF POC: NEGATIVE

## 2013-10-23 MED ORDER — SULFAMETHOXAZOLE-TMP DS 800-160 MG PO TABS: 1.0000 | ORAL_TABLET | Freq: Two times a day (BID) | ORAL | Status: DC

## 2013-10-23 NOTE — Progress Notes (Signed)
   Subjective:    Patient ID: Nancy Willis, female    DOB: March 01, 1985, 28 y.o.   MRN: [id number]  HPI Patient in today for hospital follow up- She attempted suicide several weeks ago by taking a handful of pills- SHe was in ICU for 2 days and then was transferred to behavioral health for 6 days- She was discharged on neurotin, abilify, celexa  And xanax 1mg  5 per day. The hospital was trying  To which her to klonopin but pharmacy would not fill meds so she continued her xanax. SHe sees dr. Betti Cruzeddy Dec 30, 2013. SHe says she is trying to stay on the straight and narrow.  Patient is planning on moving but not sure where. She is going to stay separated from her husband.    Review of Systems  Constitutional: Negative.   HENT: Negative.   Respiratory: Negative.   Cardiovascular: Negative.   Gastrointestinal: Negative.   Genitourinary: Negative.   Neurological: Negative.   Psychiatric/Behavioral: Positive for suicidal ideas (n\known since discharge from hospital.). The patient is nervous/anxious.   All other systems reviewed and are negative.      Objective:   Physical Exam  Constitutional: She is oriented to person, place, and time. She appears well-developed and well-nourished.  Cardiovascular: Normal rate, regular rhythm and normal heart sounds.   Pulmonary/Chest: Effort normal and breath sounds normal.  Neurological: She is alert and oriented to person, place, and time.  Skin: Skin is warm and dry.  Psychiatric: She has a normal mood and affect. Her behavior is normal. Judgment and thought content normal.  Moody- tearful.    BP 117/83  Pulse 104  Temp(Src) 97.3 F (36.3 C) (Oral)  Ht 5\' 2"  (1.575 m)  Wt 143 lb 9.6 oz (65.137 kg)  BMI 26.26 kg/m2       Assessment & Plan:  1. Hospital discharge follow-up  2. Burning with urination - POCT [facility] [place]) - POCT urinalysis dipstick - POCT UA - Microscopic Only  3. High risk sexual behavior Safe sex - POCT UA -  Microscopic Only - POCT urinalysis dipstick - POCT [facility] ([facility] [place]) - GC/Chlamydia Probe Amp  4. H/O: attempted suicide Continue counseling Continue all med rx by mental health  5. Acute cystitis without hematuria Force fluids  - sulfamethoxazole-trimethoprim (BACTRIM DS) 800-160 MG per tablet; Take 1 tablet by mouth 2 (two) times daily.  Dispense: 14 tablet; Refill: 0   Mary-Margaret Daphine DeutscherMartin, FNP

## 2013-10-23 NOTE — Addendum Note (Signed)
Addended by: Bennie PieriniMARTIN, MARY-MARGARET on: 10/23/2013 02:59 PM   Modules accepted: Orders

## 2013-10-23 NOTE — Addendum Note (Signed)
Addended by: Tommas OlpHANDY, ASHLEY N on: 10/23/2013 03:24 PM   Modules accepted: Orders

## 2013-10-27 LAB — GC/CHLAMYDIA PROBE AMP
Chlamydia trachomatis, NAA: NEGATIVE
Neisseria gonorrhoeae by PCR: NEGATIVE

## 2013-11-06 ENCOUNTER — Encounter: Payer: Self-pay | Admitting: Nurse Practitioner

## 2013-11-06 ENCOUNTER — Ambulatory Visit (INDEPENDENT_AMBULATORY_CARE_PROVIDER_SITE_OTHER): Payer: BC Managed Care – PPO | Admitting: Nurse Practitioner

## 2013-11-06 VITALS — BP 123/86 | HR 125 | Temp 97.1°F | Ht 62.0 in | Wt 137.0 lb

## 2013-11-06 DIAGNOSIS — R51 Headache: Secondary | ICD-10-CM

## 2013-11-06 DIAGNOSIS — R519 Headache, unspecified: Secondary | ICD-10-CM

## 2013-11-06 DIAGNOSIS — R0789 Other chest pain: Secondary | ICD-10-CM

## 2013-11-06 DIAGNOSIS — F3162 Bipolar disorder, current episode mixed, moderate: Secondary | ICD-10-CM

## 2013-11-06 DIAGNOSIS — F410 Panic disorder [episodic paroxysmal anxiety] without agoraphobia: Secondary | ICD-10-CM

## 2013-11-06 DIAGNOSIS — F419 Anxiety disorder, unspecified: Secondary | ICD-10-CM

## 2013-11-06 MED ORDER — KETOROLAC TROMETHAMINE 60 MG/2ML IM SOLN
60.0000 mg | Freq: Once | INTRAMUSCULAR | Status: AC
  Administered 2013-11-06: 60 mg via INTRAMUSCULAR

## 2013-11-06 NOTE — Progress Notes (Signed)
   Subjective:    Patient ID: Nancy Willis, female    DOB: 01-09-1986, 28 y.o.   MRN: [id number]  HPI Patient in today c/o chest pain and nausea- Was was in mental health for suicide attempt nd they started her on a lot of things when in the hospital- Patient takes sometimes and doesnt take other tmes. SHe as on xanax 5 x a day- yesterday she only took 2 pills and today she has taken none.    Review of Systems  Constitutional: Negative.   HENT: Negative.   Eyes: Negative.   Respiratory: Negative.   Cardiovascular: Negative.   Genitourinary: Negative.   Neurological: Negative.   Psychiatric/Behavioral: Negative.   All other systems reviewed and are negative.      Objective:   Physical Exam  Constitutional: She is oriented to person, place, and time. She appears well-developed and well-nourished.  Cardiovascular: Normal rate, regular rhythm and normal heart sounds.   Pulmonary/Chest: Effort normal and breath sounds normal.  Neurological: She is alert and oriented to person, place, and time.  Skin: Skin is warm and dry.  Psychiatric: She has a normal mood and affect. Her behavior is normal. Judgment and thought content normal.    BP 123/86  Pulse 125  Temp(Src) 97.1 F (36.2 C) (Oral)  Ht 5\' 2"  (1.575 m)  Wt 137 lb (62.143 kg)  BMI 25.05 kg/m2  EKG- Sinus tachycardia- Mary-Margaret Daphine DeutscherMartin, FNP       Assessment & Plan:   1. Other chest pain   2. Panic attack   3. Anxiety   4. Bipolar 1 disorder, mixed, moderate    NEED to go back on abilify Xanax need to be weaned off of- cannot stop cold [place] Need  To go back to in house counseling toradol injection tday  Mary-Margaret Daphine DeutscherMartin, FNP

## 2013-11-06 NOTE — Patient Instructions (Signed)

## 2013-11-09 ENCOUNTER — Encounter: Payer: Self-pay | Admitting: Nurse Practitioner

## 2013-11-10 ENCOUNTER — Encounter: Payer: Self-pay | Admitting: Family Medicine

## 2013-11-23 ENCOUNTER — Other Ambulatory Visit: Payer: Self-pay | Admitting: Nurse Practitioner

## 2013-11-23 MED ORDER — FLUCONAZOLE 150 MG PO TABS: 150.0000 mg | ORAL_TABLET | Freq: Once | ORAL | Status: DC

## 2014-01-26 ENCOUNTER — Ambulatory Visit (INDEPENDENT_AMBULATORY_CARE_PROVIDER_SITE_OTHER): Payer: BLUE CROSS/BLUE SHIELD | Admitting: Family Medicine

## 2014-01-26 DIAGNOSIS — A499 Bacterial infection, unspecified: Secondary | ICD-10-CM

## 2014-01-26 DIAGNOSIS — N390 Urinary tract infection, site not specified: Secondary | ICD-10-CM

## 2014-01-26 DIAGNOSIS — N760 Acute vaginitis: Secondary | ICD-10-CM

## 2014-01-26 LAB — POCT URINALYSIS DIPSTICK
Bilirubin, UA: NEGATIVE
Glucose, UA: NEGATIVE
Ketones, UA: NEGATIVE
Nitrite, UA: NEGATIVE
Protein, UA: NEGATIVE
Spec Grav, UA: 1.01
Urobilinogen, UA: NEGATIVE
pH, UA: 6.5

## 2014-01-26 LAB — POCT WET PREP (WET MOUNT): Trichomonas Wet Prep HPF POC: NEGATIVE

## 2014-01-26 LAB — POCT UA - MICROSCOPIC ONLY
Casts, Ur, LPF, POC: NEGATIVE
Crystals, Ur, HPF, POC: NEGATIVE
Mucus, UA: NEGATIVE
Yeast, UA: NEGATIVE

## 2014-01-26 MED ORDER — METRONIDAZOLE 500 MG PO TABS: 500.0000 mg | ORAL_TABLET | Freq: Three times a day (TID) | ORAL | Status: DC

## 2014-01-26 MED ORDER — ACIDOPHILUS EXTRA STRENGTH PO CAPS: 1.0000 | ORAL_CAPSULE | ORAL | Status: AC

## 2014-01-26 MED ORDER — CIPROFLOXACIN HCL 500 MG PO TABS: 500.0000 mg | ORAL_TABLET | Freq: Two times a day (BID) | ORAL | Status: DC

## 2014-01-26 NOTE — Progress Notes (Signed)
Subjective:    Patient ID: Nancy Willis, female    DOB: 02-19-1985, 29 y.o.   MRN: [id number]  HPI C/o recurrent BV and vaginal DC.  She has been having some dysuria.  Review of Systems  Constitutional: Negative for fever.  HENT: Negative for ear pain.   Eyes: Negative for discharge.  Respiratory: Negative for cough.   Cardiovascular: Negative for chest pain.  Gastrointestinal: Negative for abdominal distention.  Endocrine: Negative for polyuria.  Genitourinary: Negative for difficulty urinating.  Musculoskeletal: Negative for gait problem and neck pain.  Skin: Negative for color change and rash.  Neurological: Negative for speech difficulty and headaches.  Psychiatric/Behavioral: Negative for agitation.       Objective:    There were no vitals taken for this visit. Physical Exam  Constitutional: She is oriented to person, place, and time. She appears well-developed and well-nourished.  HENT:  Head: Normocephalic and atraumatic.  Mouth/Throat: Oropharynx is clear and moist.  Eyes: Pupils are equal, round, and reactive to light.  Neck: Normal range of motion. Neck supple.  Cardiovascular: Normal rate and regular rhythm.   No murmur heard. Pulmonary/Chest: Effort normal and breath sounds normal.  Abdominal: Soft. Bowel sounds are normal. There is no tenderness.  Neurological: She is alert and oriented to person, place, and time.  Skin: Skin is warm and dry.  Psychiatric: She has a normal mood and affect.   Results for orders placed or performed in visit on 01/26/14  POCT UA - Microscopic Only  Result Value Ref Range   WBC, Ur, HPF, POC 10-15    RBC, urine, microscopic 1-3    Bacteria, U Microscopic MANY    Mucus, UA NEG    Epithelial cells, urine per micros MANY    Crystals, Ur, HPF, POC NEG    Casts, Ur, LPF, POC NEG    Yeast, UA NEG   POCT urinalysis dipstick  Result Value Ref Range   Color, UA straw    Clarity, UA clear    Glucose, UA neg    Bilirubin, UA  neg    Ketones, UA neg    Spec Grav, UA 1.010    Blood, UA trace    pH, UA 6.5    Protein, UA neg    Urobilinogen, UA negative    Nitrite, UA neg    Leukocytes, UA large (3+)   POCT Wet Prep ([facility])  Result Value Ref Range   Source Wet Prep POC VAGINA    WBC, Wet Prep HPF POC 5-10    Bacteria Wet Prep HPF POC OCC    BACTERIA WET PREP MORPHOLOGY POC     Clue Cells Wet Prep HPF POC Few    Clue Cells Wet Prep Whiff POC     Yeast Wet Prep HPF POC None    KOH Wet [facility] POC     Trichomonas Wet Prep HPF POC NEG          Assessment & Plan:     ICD-9-CM ICD-10-CM   1. Urinary tract infection without hematuria, site unspecified 599.0 N39.0 metroNIDAZOLE (FLAGYL) 500 MG tablet     ciprofloxacin (CIPRO) 500 MG tablet     Lactobacillus (ACIDOPHILUS EXTRA STRENGTH) CAPS     POCT UA - Microscopic Only     POCT urinalysis dipstick     POCT Wet Prep [facility])     Urine culture  2. BV (bacterial vaginosis) 616.10 N76.0 metroNIDAZOLE (FLAGYL) 500 MG tablet   041.9 A49.9 ciprofloxacin (CIPRO)  500 MG tablet     Lactobacillus (ACIDOPHILUS EXTRA STRENGTH) CAPS     POCT UA - Microscopic Only     POCT urinalysis dipstick     POCT Wet Prep [facility])     Urine culture     No Follow-up on file.  Deatra CanterWilliam J Larry Alcock FNP

## 2014-01-28 LAB — URINE CULTURE

## 2014-02-25 ENCOUNTER — Other Ambulatory Visit (INDEPENDENT_AMBULATORY_CARE_PROVIDER_SITE_OTHER): Payer: BLUE CROSS/BLUE SHIELD

## 2014-02-25 ENCOUNTER — Other Ambulatory Visit: Payer: Self-pay | Admitting: *Deleted

## 2014-02-25 DIAGNOSIS — R319 Hematuria, unspecified: Secondary | ICD-10-CM

## 2014-02-25 DIAGNOSIS — N390 Urinary tract infection, site not specified: Secondary | ICD-10-CM

## 2014-02-25 DIAGNOSIS — R300 Dysuria: Secondary | ICD-10-CM

## 2014-02-25 DIAGNOSIS — Z7251 High risk heterosexual behavior: Secondary | ICD-10-CM

## 2014-02-25 LAB — POCT URINALYSIS DIPSTICK
BILIRUBIN UA: NEGATIVE
GLUCOSE UA: NEGATIVE
Ketones, UA: NEGATIVE
NITRITE UA: NEGATIVE
Spec Grav, UA: >=1.03
Urobilinogen, UA: NEGATIVE
pH, UA: 5

## 2014-02-25 LAB — POCT WET PREP (WET MOUNT): KOH WET PREP POC: POSITIVE

## 2014-02-25 LAB — POCT UA - MICROSCOPIC ONLY
Casts, Ur, LPF, POC: NEGATIVE
Crystals, Ur, HPF, POC: NEGATIVE
YEAST UA: NEGATIVE

## 2014-02-25 LAB — POCT URINE PREGNANCY: Preg Test, Ur: NEGATIVE

## 2014-02-25 NOTE — Addendum Note (Signed)
Addended by: Prescott GumLAND, Tylor M on: 02/25/2014 11:07 AM   Modules accepted: Orders

## 2014-02-25 NOTE — Progress Notes (Signed)
Lab only 

## 2014-02-28 LAB — GC/CHLAMYDIA PROBE AMP
Chlamydia trachomatis, NAA: NEGATIVE
Neisseria gonorrhoeae by PCR: NEGATIVE

## 2014-03-01 ENCOUNTER — Other Ambulatory Visit: Payer: Self-pay | Admitting: Nurse Practitioner

## 2014-03-01 DIAGNOSIS — N760 Acute vaginitis: Secondary | ICD-10-CM

## 2014-03-01 DIAGNOSIS — B9689 Other specified bacterial agents as the cause of diseases classified elsewhere: Secondary | ICD-10-CM

## 2014-03-01 DIAGNOSIS — N390 Urinary tract infection, site not specified: Secondary | ICD-10-CM

## 2014-03-01 MED ORDER — METRONIDAZOLE 500 MG PO TABS: 500.0000 mg | ORAL_TABLET | Freq: Three times a day (TID) | ORAL | Status: DC

## 2014-03-26 ENCOUNTER — Encounter: Payer: Self-pay | Admitting: *Deleted

## 2014-03-26 ENCOUNTER — Ambulatory Visit (INDEPENDENT_AMBULATORY_CARE_PROVIDER_SITE_OTHER): Payer: BLUE CROSS/BLUE SHIELD | Admitting: Nurse Practitioner

## 2014-03-26 ENCOUNTER — Encounter: Payer: Self-pay | Admitting: Nurse Practitioner

## 2014-03-26 VITALS — BP 113/76 | HR 104 | Temp 97.7°F | Ht 62.0 in | Wt 146.0 lb

## 2014-03-26 DIAGNOSIS — R52 Pain, unspecified: Secondary | ICD-10-CM

## 2014-03-26 DIAGNOSIS — N3000 Acute cystitis without hematuria: Secondary | ICD-10-CM

## 2014-03-26 LAB — POCT URINALYSIS DIPSTICK
Bilirubin, UA: NEGATIVE
GLUCOSE UA: NEGATIVE
KETONES UA: NEGATIVE
Nitrite, UA: POSITIVE
SPEC GRAV UA: 1.01
Urobilinogen, UA: NEGATIVE
pH, UA: 7.5

## 2014-03-26 LAB — POCT INFLUENZA A/B
Influenza A, POC: NEGATIVE
Influenza B, POC: NEGATIVE

## 2014-03-26 LAB — POCT UA - MICROSCOPIC ONLY
Casts, Ur, LPF, POC: NEGATIVE
Crystals, Ur, HPF, POC: NEGATIVE
Yeast, UA: NEGATIVE

## 2014-03-26 MED ORDER — CIPROFLOXACIN HCL 500 MG PO TABS: 500.0000 mg | ORAL_TABLET | Freq: Two times a day (BID) | ORAL | Status: AC

## 2014-03-26 NOTE — Progress Notes (Signed)
   Subjective:    Patient ID: Nancy KollerJessica Willis, female    DOB: 1985/07/19, 29 y.o.   MRN: [id number]  HPI Patient in c/o low back pain and achiness- started Wednesday- denies dysuria- constant uriary frequency.    Review of Systems  Constitutional: Negative for fever and chills.  HENT: Negative.   Respiratory: Negative.   Cardiovascular: Negative.   Genitourinary: Positive for urgency, frequency and flank pain. Negative for dysuria.  Psychiatric/Behavioral: Negative.        Objective:   Physical Exam  Constitutional: She is oriented to person, place, and time. She appears well-developed and well-nourished.  Cardiovascular: Normal rate, regular rhythm and normal heart sounds.   Pulmonary/Chest: Effort normal and breath sounds normal.  Abdominal: Soft. Bowel sounds are normal. There is tenderness (mild suprapubic pain on palpation).  Genitourinary:  No CVA tenderness  Neurological: She is alert and oriented to person, place, and time.  Skin: Skin is warm and dry.  Psychiatric: She has a normal mood and affect. Her behavior is normal. Judgment and thought content normal.   BP 113/76 mmHg  Pulse 104  Temp(Src) 97.7 F (36.5 C) (Oral)  Ht 5\' 2"  (1.575 m)  Wt 146 lb (66.225 kg)  BMI 26.70 kg/m2  Results for orders placed or performed in visit on 03/26/14  POCT Influenza A/B  Result Value Ref Range   Influenza A, POC Negative    Influenza B, POC Negative   POCT urinalysis dipstick  Result Value Ref Range   Color, UA straw    Clarity, UA cloudy    Glucose, UA negative    Bilirubin, UA negative    Ketones, UA negative    Spec Grav, UA 1.010    Blood, UA moderate    pH, UA 7.5    Protein, UA 3+++    Urobilinogen, UA negative    Nitrite, UA positive    Leukocytes, UA large (3+)   POCT UA - Microscopic Only  Result Value Ref Range   WBC, Ur, HPF, POC tntc    RBC, urine, microscopic 20-30    Bacteria, U Microscopic many    Mucus, UA modreate    Epithelial cells, urine  per micros occ    Crystals, Ur, HPF, POC negative    Casts, Ur, LPF, POC negative    Yeast, UA negative           Assessment & Plan:   1. Body aches   2. Acute cystitis without hematuria    Meds ordered this encounter  Medications  . ciprofloxacin (CIPRO) 500 MG tablet    Sig: Take 1 tablet (500 mg total) by mouth 2 (two) times daily.    Dispense:  14 tablet    Refill:  0    Order Specific Question:  Supervising Provider    Answer:  Ernestina PennaMOORE, DONALD W [[id number]]   Take medication as prescribe Cotton underwear Take shower not bath Cranberry juice, yogurt Force fluids AZO over the counter X2 days Culture pending RTO prn  Nancy Daphine DeutscherMartin, FNP

## 2014-03-26 NOTE — Patient Instructions (Signed)
Take medication as prescribe Cotton underwear Take shower not bath Cranberry juice, yogurt Force fluids AZO over the counter X2 days Culture pending RTO prn  

## 2014-03-28 LAB — URINE CULTURE

## 2014-06-26 IMAGING — CR DG CHEST 2V
2 series · 2 of 2 positions shown · non-contrast
Comparison: None.

CLINICAL DATA: Cough, chest pain.

EXAM:
CHEST  2 VIEW

[view not recorded (1 of 2)]
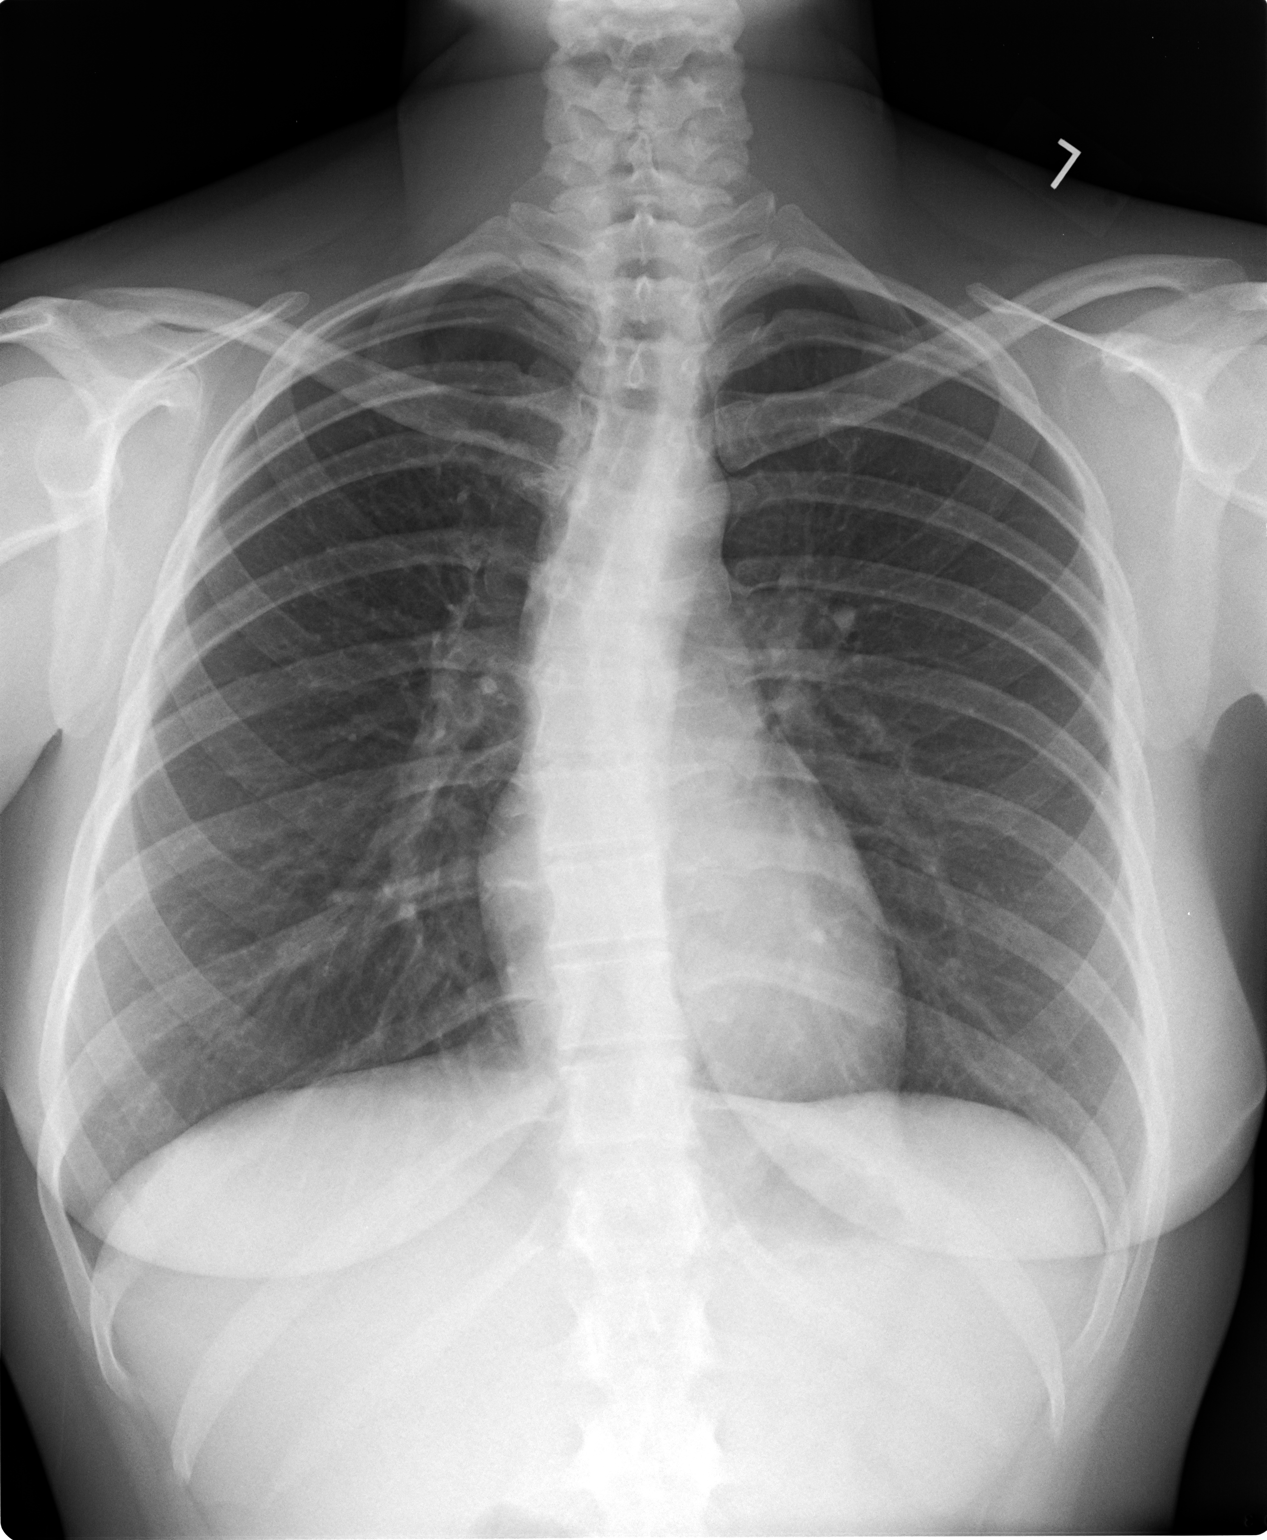

[view not recorded (2 of 2)]
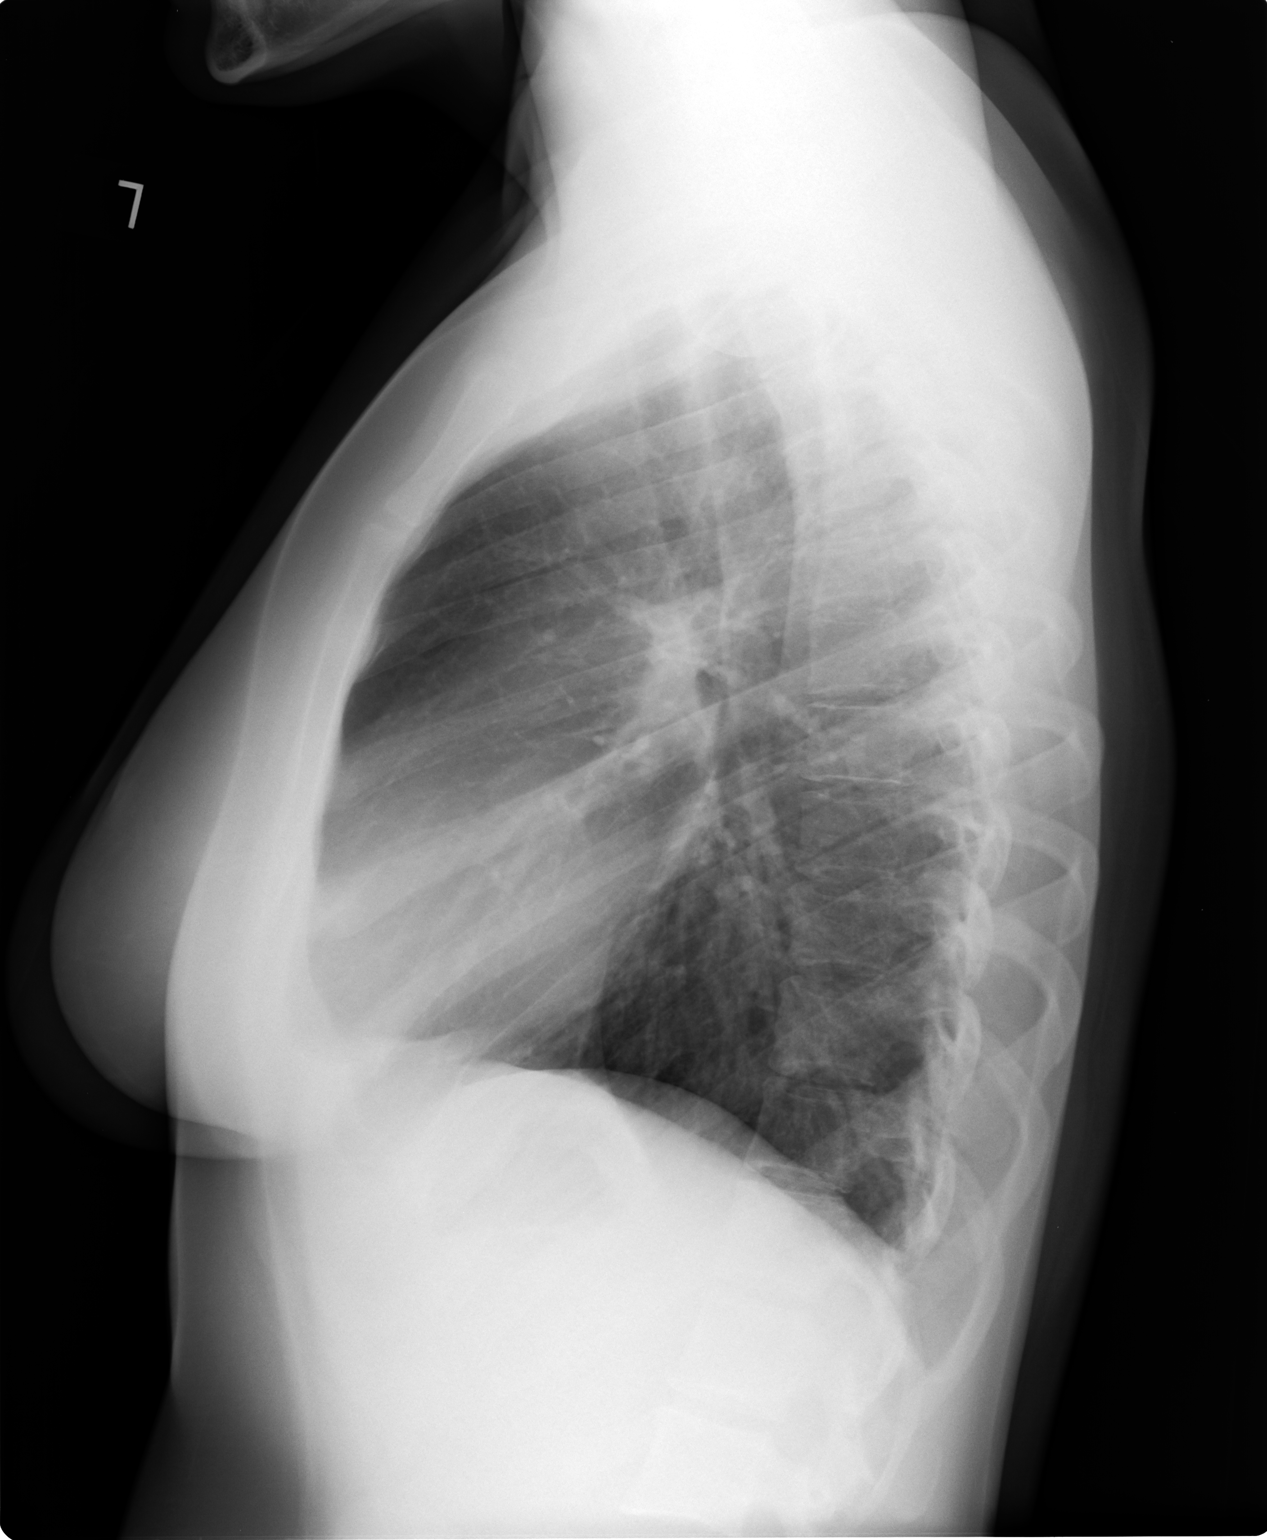

[2 of 2 positions shown; findings below may reference images not displayed]

FINDINGS: The heart size and mediastinal contours are within normal limits.
Both lungs are clear. No pleural effusion or pneumothorax is noted.
Mild dextroscoliosis of thoracic spine is noted.
IMPRESSION: No acute cardiopulmonary abnormality seen.

## 2015-01-25 ENCOUNTER — Encounter (HOSPITAL_COMMUNITY): Payer: Self-pay

## 2015-01-28 ENCOUNTER — Other Ambulatory Visit (HOSPITAL_COMMUNITY): Payer: Self-pay

## 2015-01-28 ENCOUNTER — Encounter (HOSPITAL_COMMUNITY): Payer: Self-pay

## 2015-04-26 ENCOUNTER — Ambulatory Visit: Payer: Self-pay | Admitting: Nurse Practitioner

## 2016-04-26 ENCOUNTER — Telehealth: Payer: Self-pay | Admitting: Nurse Practitioner

## 2016-04-26 NOTE — Telephone Encounter (Signed)
Wrong patient
